# Patient Record
Sex: Female | Born: 1995
Health system: Southern US, Community
[De-identification: ages and names within clinical notes are randomized; demographics above are authoritative.]

## PROBLEM LIST (undated history)

## (undated) ENCOUNTER — Ambulatory Visit (HOSPITAL_COMMUNITY): Admission: EM | Payer: Managed Care, Other (non HMO)

---
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
## 2016-03-31 ENCOUNTER — Encounter (HOSPITAL_COMMUNITY): Payer: Self-pay | Admitting: Emergency Medicine

## 2016-03-31 DIAGNOSIS — S299XXA Unspecified injury of thorax, initial encounter: Secondary | ICD-10-CM | POA: Diagnosis present

## 2016-03-31 DIAGNOSIS — S40811A Abrasion of right upper arm, initial encounter: Secondary | ICD-10-CM | POA: Diagnosis not present

## 2016-03-31 DIAGNOSIS — Y939 Activity, unspecified: Secondary | ICD-10-CM | POA: Insufficient documentation

## 2016-03-31 DIAGNOSIS — S29012A Strain of muscle and tendon of back wall of thorax, initial encounter: Secondary | ICD-10-CM | POA: Insufficient documentation

## 2016-03-31 DIAGNOSIS — Y999 Unspecified external cause status: Secondary | ICD-10-CM | POA: Insufficient documentation

## 2016-03-31 DIAGNOSIS — Y92410 Unspecified street and highway as the place of occurrence of the external cause: Secondary | ICD-10-CM | POA: Diagnosis not present

## 2016-03-31 DIAGNOSIS — S20311A Abrasion of right front wall of thorax, initial encounter: Secondary | ICD-10-CM | POA: Diagnosis not present
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
## 2016-04-01 ENCOUNTER — Emergency Department (HOSPITAL_COMMUNITY): Payer: Managed Care, Other (non HMO)

## 2016-04-01 ENCOUNTER — Emergency Department (HOSPITAL_COMMUNITY)
Admission: EM | Admit: 2016-04-01 | Discharge: 2016-04-01 | Disposition: A | Discharge status: Home / Self Care | Payer: Managed Care, Other (non HMO) | Attending: Emergency Medicine | Admitting: Emergency Medicine

## 2016-04-01 DIAGNOSIS — T07XXXA Unspecified multiple injuries, initial encounter: Secondary | ICD-10-CM

## 2016-04-01 DIAGNOSIS — S29012A Strain of muscle and tendon of back wall of thorax, initial encounter: Secondary | ICD-10-CM

## 2016-04-01 MED ORDER — NAPROXEN 500 MG PO TABS
500.0000 mg | ORAL_TABLET | Freq: Once | ORAL | Status: AC
  Administered 2016-04-01: 500 mg via ORAL
  Filled 2016-04-01: qty 1

## 2016-04-01 MED ORDER — NAPROXEN 500 MG PO TABS: 500.0000 mg | ORAL_TABLET | Freq: Two times a day (BID) | ORAL | 0 refills | Status: DC

## 2016-04-01 MED ORDER — ONDANSETRON 8 MG PO TBDP
8.0000 mg | ORAL_TABLET | Freq: Once | ORAL | Status: AC
  Administered 2016-04-01: 8 mg via ORAL
  Filled 2016-04-01: qty 1

## 2016-04-01 MED ORDER — CYCLOBENZAPRINE HCL 10 MG PO TABS: 10.0000 mg | ORAL_TABLET | Freq: Two times a day (BID) | ORAL | 0 refills | Status: DC | PRN
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
## 2016-04-05 ENCOUNTER — Encounter (HOSPITAL_COMMUNITY): Payer: Self-pay

## 2016-04-05 ENCOUNTER — Emergency Department (HOSPITAL_COMMUNITY)
Admission: EM | Admit: 2016-04-05 | Discharge: 2016-04-05 | Disposition: A | Discharge status: Home / Self Care | Payer: Managed Care, Other (non HMO) | Attending: Emergency Medicine | Admitting: Emergency Medicine

## 2016-04-05 DIAGNOSIS — Z79899 Other long term (current) drug therapy: Secondary | ICD-10-CM | POA: Insufficient documentation

## 2016-04-05 DIAGNOSIS — R112 Nausea with vomiting, unspecified: Secondary | ICD-10-CM | POA: Diagnosis present

## 2016-04-05 LAB — CBC
HEMATOCRIT: 39.1 % (ref 36.0–46.0)
HEMOGLOBIN: 13 g/dL (ref 12.0–15.0)
MCH: 23.7 pg — ABNORMAL LOW (ref 26.0–34.0)
MCHC: 33.2 g/dL (ref 30.0–36.0)
MCV: 71.4 fL — AB (ref 78.0–100.0)
Platelets: 266 10*3/uL (ref 150–400)
RBC: 5.48 MIL/uL — AB (ref 3.87–5.11)
RDW: 13.5 % (ref 11.5–15.5)
WBC: 8.6 10*3/uL (ref 4.0–10.5)

## 2016-04-05 LAB — COMPREHENSIVE METABOLIC PANEL
ALT: 10 U/L — AB (ref 14–54)
AST: 17 U/L (ref 15–41)
Albumin: 4.2 g/dL (ref 3.5–5.0)
Alkaline Phosphatase: 70 U/L (ref 38–126)
Anion gap: 12 (ref 5–15)
BILIRUBIN TOTAL: 1.9 mg/dL — AB (ref 0.3–1.2)
BUN: 7 mg/dL (ref 6–20)
CO2: 23 mmol/L (ref 22–32)
CREATININE: 0.91 mg/dL (ref 0.44–1.00)
Calcium: 9.8 mg/dL (ref 8.9–10.3)
Chloride: 104 mmol/L (ref 101–111)
GFR calc Af Amer: >60 mL/min (ref >60)
GLUCOSE: 109 mg/dL — AB (ref 65–99)
Potassium: 3 mmol/L — ABNORMAL LOW (ref 3.5–5.1)
Sodium: 139 mmol/L (ref 135–145)
TOTAL PROTEIN: 7.4 g/dL (ref 6.5–8.1)

## 2016-04-05 LAB — URINALYSIS, ROUTINE W REFLEX MICROSCOPIC
GLUCOSE, UA: NEGATIVE mg/dL
Ketones, ur: >80 mg/dL — AB
Leukocytes, UA: NEGATIVE
Nitrite: NEGATIVE
Protein, ur: 30 mg/dL — AB
SPECIFIC GRAVITY, URINE: 1.038 — AB (ref 1.005–1.030)
pH: 6 (ref 5.0–8.0)

## 2016-04-05 LAB — URINE MICROSCOPIC-ADD ON

## 2016-04-05 LAB — POC URINE PREG, ED: PREG TEST UR: NEGATIVE

## 2016-04-05 MED ORDER — ONDANSETRON HCL 4 MG PO TABS: 4.0000 mg | ORAL_TABLET | Freq: Four times a day (QID) | ORAL | 0 refills | Status: DC

## 2016-04-05 MED ORDER — ONDANSETRON HCL 4 MG/2ML IJ SOLN
4.0000 mg | Freq: Once | INTRAMUSCULAR | Status: AC
  Administered 2016-04-05: 4 mg via INTRAVENOUS
  Filled 2016-04-05 (×2): qty 2

## 2016-04-05 MED ORDER — SODIUM CHLORIDE 0.9 % IV BOLUS (SEPSIS)
1000.0000 mL | Freq: Once | INTRAVENOUS | Status: AC
  Administered 2016-04-05: 1000 mL via INTRAVENOUS

## 2016-04-05 NOTE — ED Provider Notes (Signed)
[facility]EM[facility]Y DEPT Provider Note   CSN: [id number] Arrival date & time: 04/05/16  0353     History   Chief Complaint Chief Complaint  Patient presents with  . Emesis    HPI Cynthia Moss is a 20 y.o. female.  Patient presents with complaints of nausea and vomiting. Patient reports that symptoms began yesterday. She has not been able to hold anything down. She reports a cramping pain in the epigastric area when she vomits, but no continuous pain. She has had some nasal congestion and had a brief nosebleed earlier. She has not had any significant cough or shortness of breath. She has not had any diarrhea.      History reviewed. No pertinent past medical history.  There are no active problems to display for this patient.   History reviewed. No pertinent surgical history.  OB History    No data available       Home Medications    Prior to Admission medications   Medication Sig Start Date End Date Taking? Authorizing Provider  guaifenesin (ROBITUSSIN) 100 MG/5ML syrup Take 400 mg by mouth 3 (three) times daily as needed for cough.   Yes Historical Provider, MD  cyclobenzaprine (FLEXERIL) 10 MG tablet Take 1 tablet (10 mg total) by mouth 2 (two) times daily as needed for muscle spasms. 04/01/16   Antony MaduraKelly Humes, PA-C  naproxen (NAPROSYN) 500 MG tablet Take 1 tablet (500 mg total) by mouth 2 (two) times daily. 04/01/16   Antony MaduraKelly Humes, PA-C  ondansetron (ZOFRAN) 4 MG tablet Take 1 tablet (4 mg total) by mouth every 6 (six) hours. 04/05/16   Gilda Creasehristopher J Sanjna Haskew, MD    Family History No family history on file.  Social History Social History  Substance Use Topics  . Smoking status: Never Smoker  . Smokeless tobacco: Never Used  . Alcohol use No     Allergies   Review of patient's allergies indicates no known allergies.   Review of Systems Review of Systems  HENT: Positive for congestion.   Gastrointestinal: Positive for nausea and vomiting.  Genitourinary: Negative  for dysuria.  All other systems reviewed and are negative.    Physical Exam Updated Vital Signs BP 123/71   Pulse 94   Temp 98.6 F (37 C) (Oral)   Resp 19   Ht 5\' 5"  (1.651 m)   Wt 181 lb (82.1 kg)   LMP 03/24/2016   SpO2 100%   BMI 30.12 kg/m   Physical Exam  Constitutional: She is oriented to person, place, and time. She appears well-developed and well-nourished. No distress.  HENT:  Head: Normocephalic and atraumatic.  Right Ear: Hearing normal.  Left Ear: Hearing normal.  Nose: Nose normal.  Mouth/Throat: Oropharynx is clear and moist and mucous membranes are normal.  Eyes: Conjunctivae and EOM are normal. Pupils are equal, round, and reactive to light.  Neck: Normal range of motion. Neck supple.  Cardiovascular: Regular rhythm, S1 normal and S2 normal.  Exam reveals no gallop and no friction rub.   No murmur heard. Pulmonary/Chest: Effort normal and breath sounds normal. No respiratory distress. She exhibits no tenderness.  Abdominal: Soft. Normal appearance and bowel sounds are normal. There is no hepatosplenomegaly. There is no tenderness. There is no rebound, no guarding, no tenderness at McBurney's point and negative Murphy's sign. No hernia.  Musculoskeletal: Normal range of motion.  Neurological: She is alert and oriented to person, place, and time. She has normal strength. No cranial nerve deficit or sensory deficit. Coordination  normal. GCS eye subscore is 4. GCS verbal subscore is 5. GCS motor subscore is 6.  Skin: Skin is warm, dry and intact. No rash noted. No cyanosis.  Psychiatric: She has a normal mood and affect. Her speech is normal and behavior is normal. Thought content normal.  Nursing note and vitals reviewed.    ED Treatments / Results  Labs (all labs ordered are listed, but only abnormal results are displayed) Labs Reviewed  CBC - Abnormal; Notable for the following:       Result Value   RBC 5.48 (*)    MCV 71.4 (*)    MCH 23.7 (*)    All  other components within normal limits  COMPREHENSIVE METABOLIC PANEL - Abnormal; Notable for the following:    Potassium 3.0 (*)    Glucose, Bld 109 (*)    ALT 10 (*)    Total Bilirubin 1.9 (*)    All other components within normal limits  URINALYSIS, ROUTINE W REFLEX MICROSCOPIC (NOT AT [facility]) - Abnormal; Notable for the following:    Specific Gravity, Urine 1.038 (*)    Hgb urine dipstick SMALL (*)    Bilirubin Urine SMALL (*)    Ketones, ur >80 (*)    Protein, ur 30 (*)    All other components within normal limits  URINE MICROSCOPIC-ADD ON - Abnormal; Notable for the following:    Squamous Epithelial / LPF 6-30 (*)    Bacteria, UA MANY (*)    All other components within normal limits  POC URINE PREG, ED    EKG  EKG Interpretation None       Radiology No results found.  Procedures Procedures (including critical care time)  Medications Ordered in ED Medications  sodium chloride 0.9 % bolus 1,000 mL (0 mLs Intravenous Stopped 04/05/16 0514)  ondansetron (ZOFRAN) injection 4 mg (4 mg Intravenous Given 04/05/16 0417)     Initial Impression / Assessment and Plan / ED Course  I have reviewed the triage vital signs and the nursing notes.  Pertinent labs & imaging results that were available during my care of the patient were reviewed by me and considered in my medical decision making (see chart for details).  Clinical Course   Patient presents to the emergency department for evaluation of nausea and vomiting. Patient had a benign, nontender abdominal exam. Workup has been unremarkable. Patient administered antiemetics and IV fluids with improvement. Patient will be discharged with continued symptomatic treatment, oral hydration.  Final Clinical Impressions(s) / ED Diagnoses   Final diagnoses:  Nausea and vomiting, vomiting of unspecified type    New Prescriptions New Prescriptions   ONDANSETRON (ZOFRAN) 4 MG TABLET    Take 1 tablet (4 mg total) by mouth every 6 (six)  hours.     Gilda Crease, MD 04/05/16 [phone]

## 2016-04-05 NOTE — ED Triage Notes (Signed)
Pt here with c/o N/V and epigastric abdominal pain, onset yesterday. Pt actively vomiting during triage. Pt also reports constipation, LBM was yesterday but states it was hard.

## 2016-04-06 ENCOUNTER — Encounter (HOSPITAL_COMMUNITY): Payer: Self-pay | Admitting: Emergency Medicine

## 2016-04-06 ENCOUNTER — Emergency Department (HOSPITAL_COMMUNITY)
Admission: EM | Admit: 2016-04-06 | Discharge: 2016-04-06 | Disposition: A | Discharge status: Home / Self Care | Payer: Managed Care, Other (non HMO) | Attending: Emergency Medicine | Admitting: Emergency Medicine

## 2016-04-06 DIAGNOSIS — R112 Nausea with vomiting, unspecified: Secondary | ICD-10-CM | POA: Diagnosis present

## 2016-04-06 LAB — URINE MICROSCOPIC-ADD ON

## 2016-04-06 LAB — COMPREHENSIVE METABOLIC PANEL
ALT: 9 U/L — ABNORMAL LOW (ref 14–54)
AST: 15 U/L (ref 15–41)
Albumin: 4.2 g/dL (ref 3.5–5.0)
Alkaline Phosphatase: 67 U/L (ref 38–126)
Anion gap: 12 (ref 5–15)
BUN: 6 mg/dL (ref 6–20)
CHLORIDE: 106 mmol/L (ref 101–111)
CO2: 21 mmol/L — ABNORMAL LOW (ref 22–32)
Calcium: 9.6 mg/dL (ref 8.9–10.3)
Creatinine, Ser: 0.89 mg/dL (ref 0.44–1.00)
GFR calc Af Amer: >60 mL/min (ref >60)
Glucose, Bld: 102 mg/dL — ABNORMAL HIGH (ref 65–99)
POTASSIUM: 3.5 mmol/L (ref 3.5–5.1)
Sodium: 139 mmol/L (ref 135–145)
Total Bilirubin: 1.7 mg/dL — ABNORMAL HIGH (ref 0.3–1.2)
Total Protein: 7.4 g/dL (ref 6.5–8.1)

## 2016-04-06 LAB — URINALYSIS, ROUTINE W REFLEX MICROSCOPIC
GLUCOSE, UA: NEGATIVE mg/dL
Ketones, ur: >80 mg/dL — AB
LEUKOCYTES UA: NEGATIVE
Nitrite: NEGATIVE
PH: 5.5 (ref 5.0–8.0)
Protein, ur: 30 mg/dL — AB
SPECIFIC GRAVITY, URINE: 1.045 — AB (ref 1.005–1.030)

## 2016-04-06 LAB — CBC
HEMATOCRIT: 37.9 % (ref 36.0–46.0)
HEMOGLOBIN: 12.4 g/dL (ref 12.0–15.0)
MCH: 23.4 pg — ABNORMAL LOW (ref 26.0–34.0)
MCHC: 32.7 g/dL (ref 30.0–36.0)
MCV: 71.6 fL — AB (ref 78.0–100.0)
Platelets: 271 10*3/uL (ref 150–400)
RBC: 5.29 MIL/uL — AB (ref 3.87–5.11)
RDW: 13.8 % (ref 11.5–15.5)
WBC: 8.8 10*3/uL (ref 4.0–10.5)

## 2016-04-06 LAB — LIPASE, BLOOD: LIPASE: 24 U/L (ref 11–51)

## 2016-04-06 LAB — I-STAT BETA HCG BLOOD, ED (MC, WL, AP ONLY): I-stat hCG, quantitative: <5 m[IU]/mL (ref <5)

## 2016-04-06 MED ORDER — ONDANSETRON 8 MG PO TBDP: 8.0000 mg | ORAL_TABLET | Freq: Three times a day (TID) | ORAL | 0 refills | Status: DC | PRN

## 2016-04-06 MED ORDER — ONDANSETRON 4 MG PO TBDP
8.0000 mg | ORAL_TABLET | Freq: Once | ORAL | Status: AC
  Administered 2016-04-06: 8 mg via ORAL
  Filled 2016-04-06: qty 2

## 2016-04-06 MED ORDER — MORPHINE SULFATE (PF) 4 MG/ML IV SOLN: 4.0000 mg | Freq: Once | INTRAVENOUS | Status: DC

## 2016-04-06 NOTE — ED Notes (Signed)
Pt provided with d/c instructions at this time. Pt verbalizes understanding of d/c instructions as well as follow up procedure after d/c. Pt provided with RX for zofran 8 mg tablets. Pt verbalizes understanding of RX directions. Pt in no apparent distress at this time. Pt ambulatory at time of d/c.

## 2016-04-06 NOTE — ED Provider Notes (Signed)
[facility] Provider Note   CSN: [id number] Arrival date & time: 04/06/16  0044     History   Chief Complaint Chief Complaint  Patient presents with  . Emesis    HPI Cynthia Moss is a 20 y.o. female.  HPI Patient ports nausea and vomiting over the past 3 days.  She did have diarrhea today.  She was seen in emergency department yesterday for similar symptoms and worked up.  She was discharged home with a prescription for Zofran but she did not fill this.  She had started to feel better and she vomited again this evening and came to the ER for evaluation without filling her prescription.  She denies significant abdominal pain at this time.  No fevers or chills.  Denies cough or shortness of breath.  No dysuria or urinary frequency.  No recent sick contacts.  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    History reviewed. No pertinent past medical history.  There are no active problems to display for this patient.   History reviewed. No pertinent surgical history.  OB History    No data available       Home Medications    Prior to Admission medications   Medication Sig Start Date End Date Taking? Authorizing Provider  cyclobenzaprine (FLEXERIL) 10 MG tablet Take 1 tablet (10 mg total) by mouth 2 (two) times daily as needed for muscle spasms. 04/01/16   Antony MaduraKelly Humes, PA-C  guaifenesin (ROBITUSSIN) 100 MG/5ML syrup Take 400 mg by mouth 3 (three) times daily as needed for cough.    Historical Provider, MD  naproxen (NAPROSYN) 500 MG tablet Take 1 tablet (500 mg total) by mouth 2 (two) times daily. 04/01/16   Antony MaduraKelly Humes, PA-C  ondansetron (ZOFRAN ODT) 8 MG disintegrating tablet Take 1 tablet (8 mg total) by mouth every 8 (eight) hours as needed for nausea or vomiting. 04/06/16   Azalia BilisKevin Aurora Rody, MD  ondansetron (ZOFRAN) 4 MG tablet Take 1 tablet (4 mg total) by mouth every 6 (six) hours. 04/05/16   Gilda Creasehristopher J Pollina, MD    Family History No family history on  file.  Social History Social History  Substance Use Topics  . Smoking status: Never Smoker  . Smokeless tobacco: Never Used  . Alcohol use No     Allergies   Review of patient's allergies indicates no known allergies.   Review of Systems Review of Systems  All other systems reviewed and are negative.    Physical Exam Updated Vital Signs BP 134/73 (BP Location: Right Arm)   Pulse 105   Temp 98.4 F (36.9 C) (Oral)   Resp 20   Ht 5\' 5"  (1.651 m)   Wt 181 lb (82.1 kg)   LMP 03/24/2016   SpO2 100%   BMI 30.12 kg/m   Physical Exam  Constitutional: She is oriented to person, place, and time. She appears well-developed and well-nourished. No distress.  HENT:  Head: Normocephalic and atraumatic.  Eyes: EOM are normal.  Neck: Normal range of motion.  Cardiovascular: Normal rate, regular rhythm and normal heart sounds.   Pulmonary/Chest: Effort normal and breath sounds normal.  Abdominal: Soft. She exhibits no distension. There is no tenderness.  Musculoskeletal: Normal range of motion.  Neurological: She is alert and oriented to person, place, and time.  Skin: Skin is warm and dry.  Psychiatric: She has a normal mood and affect. Judgment normal.  Nursing note and vitals reviewed.    ED Treatments /  Results  Labs (all labs ordered are listed, but only abnormal results are displayed) Labs Reviewed  COMPREHENSIVE METABOLIC PANEL - Abnormal; Notable for the following:       Result Value   CO2 21 (*)    Glucose, Bld 102 (*)    ALT 9 (*)    Total Bilirubin 1.7 (*)    All other components within normal limits  CBC - Abnormal; Notable for the following:    RBC 5.29 (*)    MCV 71.6 (*)    MCH 23.4 (*)    All other components within normal limits  URINALYSIS, ROUTINE W REFLEX MICROSCOPIC (NOT AT [facility]) - Abnormal; Notable for the following:    Color, Urine AMBER (*)    APPearance TURBID (*)    Specific Gravity, Urine 1.045 (*)    Hgb urine dipstick LARGE (*)     Bilirubin Urine SMALL (*)    Ketones, ur >80 (*)    Protein, ur 30 (*)    All other components within normal limits  URINE MICROSCOPIC-ADD ON - Abnormal; Notable for the following:    Squamous Epithelial / LPF 6-30 (*)    Bacteria, UA MANY (*)    All other components within normal limits  LIPASE, BLOOD  POC URINE PREG, ED  I-STAT BETA HCG BLOOD, ED ([facility], [facility], [facility] ONLY)    EKG  EKG Interpretation None       Radiology No results found.  Procedures Procedures (including critical care time)  Medications Ordered in ED Medications  ondansetron (ZOFRAN-ODT) disintegrating tablet 8 mg (8 mg Oral Given 04/06/16 0537)     Initial Impression / Assessment and Plan / ED Course  I have reviewed the triage vital signs and the nursing notes.  Pertinent labs & imaging results that were available during my care of the patient were reviewed by me and considered in my medical decision making (see chart for details).  Clinical Course    Patient is overall well-appearing.  Repeat workup today without abnormality.  Abdominal exam benign.  No indication for advanced imaging.  Patient given Zofran here in the emergency department.  I encouraged her to fill her prescription for Zofran for her recurrent nausea vomiting.  This seems to be more of a viral process is that she given the episode of nonbloody diarrhea today.  Final Clinical Impressions(s) / ED Diagnoses   Final diagnoses:  Nausea and vomiting, vomiting of unspecified type    New Prescriptions Discharge Medication List as of 04/06/2016  5:33 AM    START taking these medications   Details  ondansetron (ZOFRAN ODT) 8 MG disintegrating tablet Take 1 tablet (8 mg total) by mouth every 8 (eight) hours as needed for nausea or vomiting., Starting Fri 04/06/2016, Print         Azalia Bilis, MD 04/06/16 [phone]
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
## 2016-06-20 ENCOUNTER — Encounter (HOSPITAL_COMMUNITY): Payer: Self-pay

## 2016-06-20 ENCOUNTER — Emergency Department (HOSPITAL_COMMUNITY)
Admission: EM | Admit: 2016-06-20 | Discharge: 2016-06-20 | Disposition: A | Discharge status: Home / Self Care | Payer: Managed Care, Other (non HMO) | Attending: Emergency Medicine | Admitting: Emergency Medicine

## 2016-06-20 DIAGNOSIS — R1110 Vomiting, unspecified: Secondary | ICD-10-CM | POA: Diagnosis present

## 2016-06-20 DIAGNOSIS — R1111 Vomiting without nausea: Secondary | ICD-10-CM | POA: Insufficient documentation

## 2016-06-20 LAB — URINALYSIS, ROUTINE W REFLEX MICROSCOPIC
GLUCOSE, UA: NEGATIVE mg/dL
KETONES UR: 15 mg/dL — AB
LEUKOCYTES UA: NEGATIVE
NITRITE: NEGATIVE
PH: 5.5 (ref 5.0–8.0)
PROTEIN: 100 mg/dL — AB
Specific Gravity, Urine: 1.027 (ref 1.005–1.030)

## 2016-06-20 LAB — COMPREHENSIVE METABOLIC PANEL
ALBUMIN: 4.1 g/dL (ref 3.5–5.0)
ALK PHOS: 65 U/L (ref 38–126)
ALT: 8 U/L — AB (ref 14–54)
AST: 16 U/L (ref 15–41)
Anion gap: 8 (ref 5–15)
BILIRUBIN TOTAL: 1.6 mg/dL — AB (ref 0.3–1.2)
BUN: 6 mg/dL (ref 6–20)
CO2: 25 mmol/L (ref 22–32)
CREATININE: 0.82 mg/dL (ref 0.44–1.00)
Calcium: 9.7 mg/dL (ref 8.9–10.3)
Chloride: 106 mmol/L (ref 101–111)
GFR calc Af Amer: >60 mL/min (ref >60)
GLUCOSE: 98 mg/dL (ref 65–99)
Potassium: 3.4 mmol/L — ABNORMAL LOW (ref 3.5–5.1)
Sodium: 139 mmol/L (ref 135–145)
TOTAL PROTEIN: 6.9 g/dL (ref 6.5–8.1)

## 2016-06-20 LAB — URINE MICROSCOPIC-ADD ON

## 2016-06-20 LAB — CBC
HEMATOCRIT: 36.8 % (ref 36.0–46.0)
Hemoglobin: 12.4 g/dL (ref 12.0–15.0)
MCH: 23.8 pg — ABNORMAL LOW (ref 26.0–34.0)
MCHC: 33.7 g/dL (ref 30.0–36.0)
MCV: 70.8 fL — ABNORMAL LOW (ref 78.0–100.0)
PLATELETS: 293 10*3/uL (ref 150–400)
RBC: 5.2 MIL/uL — ABNORMAL HIGH (ref 3.87–5.11)
RDW: 13.9 % (ref 11.5–15.5)
WBC: 5.9 10*3/uL (ref 4.0–10.5)

## 2016-06-20 LAB — LIPASE, BLOOD: Lipase: 24 U/L (ref 11–51)

## 2016-06-20 LAB — POC URINE PREG, ED: Preg Test, Ur: NEGATIVE

## 2016-06-20 MED ORDER — ONDANSETRON 4 MG PO TBDP
4.0000 mg | ORAL_TABLET | Freq: Once | ORAL | Status: AC
  Administered 2016-06-20: 4 mg via ORAL
  Filled 2016-06-20: qty 1

## 2016-06-20 MED ORDER — ONDANSETRON 4 MG PO TBDP: 4.0000 mg | ORAL_TABLET | Freq: Three times a day (TID) | ORAL | 0 refills | Status: DC | PRN

## 2016-06-20 NOTE — ED Provider Notes (Signed)
[facility] Provider Note   CSN: [id number] Arrival date & time: 06/20/16  0218     History   Chief Complaint Chief Complaint  Patient presents with  . Nausea  . Vomiting    HPI Cynthia Moss is a 20 y.o. female.  HPI  This is a 20 year old female with no significant past medical history who presents with vomiting. Patient reports onset of vomiting after eating some noodles yesterday afternoon. She states that the needles didn't "taste right." No one else is sick. She reports multiple episodes of nonbilious, nonbloody emesis. She reports abdominal cramping. Denies diarrhea. Current pain is 4 out of 10. She has not taken anything for her symptoms. Denies any fevers, chest pain, shortness of breath.  History reviewed. No pertinent past medical history.  There are no active problems to display for this patient.   History reviewed. No pertinent surgical history.  OB History    No data available       Home Medications    Prior to Admission medications   Medication Sig Start Date End Date Taking? Authorizing Provider  cyclobenzaprine (FLEXERIL) 10 MG tablet Take 1 tablet (10 mg total) by mouth 2 (two) times daily as needed for muscle spasms. Patient not taking: Reported on 06/20/2016 04/01/16   Antony MaduraKelly Humes, PA-C  naproxen (NAPROSYN) 500 MG tablet Take 1 tablet (500 mg total) by mouth 2 (two) times daily. Patient not taking: Reported on 06/20/2016 04/01/16   Antony MaduraKelly Humes, PA-C  ondansetron (ZOFRAN ODT) 4 MG disintegrating tablet Take 1 tablet (4 mg total) by mouth every 8 (eight) hours as needed for nausea or vomiting. 06/20/16   Shon Batonourtney F Kelijah Towry, MD  ondansetron (ZOFRAN) 4 MG tablet Take 1 tablet (4 mg total) by mouth every 6 (six) hours. Patient not taking: Reported on 06/20/2016 04/05/16   Gilda Creasehristopher J Pollina, MD    Family History No family history on file.  Social History Social History  Substance Use Topics  . Smoking status: Never Smoker  . Smokeless  tobacco: Never Used  . Alcohol use No     Allergies   Patient has no known allergies.   Review of Systems Review of Systems  Constitutional: Negative for fever.  Respiratory: Negative for shortness of breath.   Cardiovascular: Negative for chest pain.  Gastrointestinal: Positive for abdominal pain, nausea and vomiting. Negative for diarrhea.  Musculoskeletal: Positive for back pain.  All other systems reviewed and are negative.    Physical Exam Updated Vital Signs BP 124/78   Pulse 78   Temp 97.8 F (36.6 C) (Oral)   Resp 17   LMP 06/15/2016   SpO2 100%   Physical Exam  Constitutional: She is oriented to person, place, and time. She appears well-developed and well-nourished. No distress.  HENT:  Head: Normocephalic and atraumatic.  Cardiovascular: Normal rate, regular rhythm and normal heart sounds.   No murmur heard. Pulmonary/Chest: Effort normal and breath sounds normal. No respiratory distress. She has no wheezes.  Abdominal: Soft. Bowel sounds are normal. There is no tenderness. There is no guarding.  Neurological: She is alert and oriented to person, place, and time.  Skin: Skin is warm and dry.  Psychiatric: She has a normal mood and affect.  Nursing note and vitals reviewed.    ED Treatments / Results  Labs (all labs ordered are listed, but only abnormal results are displayed) Labs Reviewed  COMPREHENSIVE METABOLIC PANEL - Abnormal; Notable for the following:       Result Value  Potassium 3.4 (*)    ALT 8 (*)    Total Bilirubin 1.6 (*)    All other components within normal limits  CBC - Abnormal; Notable for the following:    RBC 5.20 (*)    MCV 70.8 (*)    MCH 23.8 (*)    All other components within normal limits  URINALYSIS, ROUTINE W REFLEX MICROSCOPIC ([facility]) - Abnormal; Notable for the following:    Color, Urine AMBER (*)    APPearance CLOUDY (*)    Hgb urine dipstick LARGE (*)    Bilirubin Urine SMALL (*)    Ketones, ur 15 (*)     Protein, ur 100 (*)    All other components within normal limits  URINE MICROSCOPIC-ADD ON - Abnormal; Notable for the following:    Squamous Epithelial / LPF 6-30 (*)    Bacteria, UA MANY (*)    All other components within normal limits  LIPASE, BLOOD  POC URINE PREG, ED    EKG  EKG Interpretation None       Radiology No results found.  Procedures Procedures (including critical care time)  Medications Ordered in ED Medications  ondansetron (ZOFRAN-ODT) disintegrating tablet 4 mg (not administered)  ondansetron (ZOFRAN-ODT) disintegrating tablet 4 mg (4 mg Oral Given 06/20/16 0453)     Initial Impression / Assessment and Plan / ED Course  I have reviewed the triage vital signs and the nursing notes.  Pertinent labs & imaging results that were available during my care of the patient were reviewed by me and considered in my medical decision making (see chart for details).  Clinical Course    Patient presents with abdominal cramping and vomiting. This is following eating yesterday. Otherwise nontoxic. Abdominal exam is benign. Vital signs reassuring. Patient given Zofran ODT. Lab work reviewed and largely reassuring. She is able to tolerate fluids. Suspect gastritis. Discharge home with Zofran.  After history, exam, and medical workup I feel the patient has been appropriately medically screened and is safe for discharge home. Pertinent diagnoses were discussed with the patient. Patient was given return precautions.   Final Clinical Impressions(s) / ED Diagnoses   Final diagnoses:  Non-intractable vomiting without nausea, unspecified vomiting type    New Prescriptions New Prescriptions   ONDANSETRON (ZOFRAN ODT) 4 MG DISINTEGRATING TABLET    Take 1 tablet (4 mg total) by mouth every 8 (eight) hours as needed for nausea or vomiting.     Shon Batonourtney F Doree Kuehne, MD 06/20/16 [phone]

## 2016-06-20 NOTE — ED Triage Notes (Signed)
Pt states she ate some noodles and has had n/v since; pt a&ox 4 on arrival. Pt states she has tightness in abdomen; pt c/o pain at 6/10 on arrival.

## 2016-07-27 ENCOUNTER — Encounter (HOSPITAL_COMMUNITY): Payer: Self-pay | Admitting: Emergency Medicine

## 2016-07-27 ENCOUNTER — Ambulatory Visit (HOSPITAL_COMMUNITY)
Admission: EM | Admit: 2016-07-27 | Discharge: 2016-07-27 | Disposition: A | Discharge status: Home / Self Care | Payer: Managed Care, Other (non HMO) | Attending: Family Medicine | Admitting: Family Medicine

## 2016-07-27 DIAGNOSIS — B9689 Other specified bacterial agents as the cause of diseases classified elsewhere: Secondary | ICD-10-CM | POA: Diagnosis not present

## 2016-07-27 DIAGNOSIS — N730 Acute parametritis and pelvic cellulitis: Secondary | ICD-10-CM | POA: Diagnosis not present

## 2016-07-27 DIAGNOSIS — N760 Acute vaginitis: Secondary | ICD-10-CM | POA: Insufficient documentation

## 2016-07-27 DIAGNOSIS — N739 Female pelvic inflammatory disease, unspecified: Secondary | ICD-10-CM | POA: Insufficient documentation

## 2016-07-27 MED ORDER — CEFTRIAXONE SODIUM 250 MG IJ SOLR
250.0000 mg | Freq: Once | INTRAMUSCULAR | Status: AC
  Administered 2016-07-27: 250 mg via INTRAMUSCULAR

## 2016-07-27 MED ORDER — DOXYCYCLINE HYCLATE 100 MG PO CAPS: 100.0000 mg | ORAL_CAPSULE | Freq: Two times a day (BID) | ORAL | 0 refills | Status: AC

## 2016-07-27 MED ORDER — LIDOCAINE HCL (PF) 1 % IJ SOLN
INTRAMUSCULAR | Status: AC
  Filled 2016-07-27: qty 2

## 2016-07-27 MED ORDER — METRONIDAZOLE 500 MG PO TABS: 500.0000 mg | ORAL_TABLET | Freq: Two times a day (BID) | ORAL | 0 refills | Status: DC

## 2016-07-27 MED ORDER — CEFTRIAXONE SODIUM 250 MG IJ SOLR
INTRAMUSCULAR | Status: AC
  Filled 2016-07-27: qty 250

## 2016-07-27 NOTE — ED Triage Notes (Signed)
Here for poss yeast infection onset 4 days associated w/vag itching  Denies vag d/c, urinary sx, fevers, abd pain  LMP = 07/23/16  Pt reports she just became sexually active last month in a monogamous relationship  A&O x4... NAD

## 2016-07-27 NOTE — Discharge Instructions (Signed)
Take your medicines as prescribed. You have been tested for gonorrhea, chlamydia, bacterial vaginosis, and trichomonas. You are being treated today for gonorrhea, and for PID along with bacterial vaginosis.   It is important to complete all of your antibiotics it is also important to avoid any alcohol while taking metronidazole.

## 2016-07-27 NOTE — ED Provider Notes (Signed)
CSN: [id number]     Arrival date & time 07/27/16  1019 History   First MD Initiated Contact with Patient 07/27/16 1039     Chief Complaint  Patient presents with  . Vaginitis   (Consider location/radiation/quality/duration/timing/severity/associated sxs/prior Treatment) 20 year old female presents to clinic with one week history of vaginal itching. Patient states she recently has become sexually active, with one partner and uses barrier methods for protection. Patient denies discharge, pain with intercourse, abdominal pain, or flank pain. She has been afebrile, no N/V/D, or other systemic symptoms   The history is provided by the patient.    No past medical history on file. History reviewed. No pertinent surgical history. History reviewed. No pertinent family history. Social History  Substance Use Topics  . Smoking status: Never Smoker  . Smokeless tobacco: Never Used  . Alcohol use Yes   OB History    No data available     Review of Systems  Constitutional: Negative.   HENT: Negative.   Eyes: Negative.   Respiratory: Negative.   Cardiovascular: Negative.   Gastrointestinal: Negative for abdominal pain, diarrhea and nausea.  Genitourinary: Negative for difficulty urinating, dyspareunia, dysuria, flank pain, frequency, genital sores, menstrual problem, urgency, vaginal bleeding, vaginal discharge and vaginal pain.  Musculoskeletal: Negative.   Neurological: Negative.     Allergies  Patient has no known allergies.  Home Medications   Prior to Admission medications   Medication Sig Start Date End Date Taking? Authorizing Provider  cyclobenzaprine (FLEXERIL) 10 MG tablet Take 1 tablet (10 mg total) by mouth 2 (two) times daily as needed for muscle spasms. Patient not taking: Reported on 06/20/2016 04/01/16   Antony MaduraKelly Humes, PA-C  doxycycline (VIBRAMYCIN) 100 MG capsule Take 1 capsule (100 mg total) by mouth 2 (two) times daily. 07/27/16 08/10/16  Dorena BodoLawrence Arsalan Brisbin, NP   metroNIDAZOLE (FLAGYL) 500 MG tablet Take 1 tablet (500 mg total) by mouth 2 (two) times daily. 07/27/16   Dorena BodoLawrence Jalilah Wiltsie, NP  naproxen (NAPROSYN) 500 MG tablet Take 1 tablet (500 mg total) by mouth 2 (two) times daily. Patient not taking: Reported on 06/20/2016 04/01/16   Antony MaduraKelly Humes, PA-C  ondansetron (ZOFRAN ODT) 4 MG disintegrating tablet Take 1 tablet (4 mg total) by mouth every 8 (eight) hours as needed for nausea or vomiting. 06/20/16   Shon Batonourtney F Horton, MD  ondansetron (ZOFRAN) 4 MG tablet Take 1 tablet (4 mg total) by mouth every 6 (six) hours. Patient not taking: Reported on 06/20/2016 04/05/16   Gilda Creasehristopher J Pollina, MD   Meds Ordered and Administered this Visit   Medications  cefTRIAXone (ROCEPHIN) injection 250 mg (not administered)    BP 134/72 (BP Location: Left Arm)   Pulse 109   Temp 98.2 F (36.8 C) (Oral)   Resp 16   LMP 07/23/2016   SpO2 100%  No data found.   Physical Exam  Constitutional: She is oriented to person, place, and time. She appears well-developed and well-nourished. No distress.  HENT:  Head: Normocephalic.  Right Ear: External ear normal.  Left Ear: External ear normal.  Neck: Normal range of motion. Neck supple. No JVD present.  Cardiovascular: Normal rate, regular rhythm and normal heart sounds.   Pulmonary/Chest: Effort normal and breath sounds normal. No respiratory distress. She has no wheezes.  Abdominal: Soft. Bowel sounds are normal. She exhibits no distension. There is no tenderness. There is no guarding.  Genitourinary: Pelvic exam was performed with patient prone. There is tenderness on the right labia. There  is no rash, lesion or injury on the right labia. There is tenderness on the left labia. There is no rash, lesion or injury on the left labia. Uterus is tender. There is tenderness in the vagina. No erythema or bleeding in the vagina. No foreign body in the vagina. No signs of injury around the vagina. Vaginal discharge found.   Lymphadenopathy:    She has no cervical adenopathy.  Neurological: She is alert and oriented to person, place, and time.  Skin: Skin is warm and dry. Capillary refill takes less than 2 seconds. She is not diaphoretic. No pallor.  Psychiatric: She has a normal mood and affect.    Urgent Care Course   Clinical Course     Procedures (including critical care time)  Labs Review Labs Reviewed  CERVICOVAGINAL ANCILLARY ONLY    Imaging Review No results found.   Visual Acuity Review  Right Eye Distance:   Left Eye Distance:   Bilateral Distance:    Right Eye Near:   Left Eye Near:    Bilateral Near:         MDM   1. PID (acute pelvic inflammatory disease)   2. BV (bacterial vaginosis)    Patient is being treated for GC/Cl[place], PID, and bacterial vaginosis. Specimens have been collected for cytology and patient has received an injection today of rocephin, and given a prescription of doxycycline for 2 weeks and metronidazole. UA and UA-preg collected. Should symptoms fail to resolve or worsen obtain and follow up with a PCP or return to clinic.     Dorena BodoLawrence Audryanna Zurita, NP 07/27/16 [phone]

## 2016-07-29 ENCOUNTER — Ambulatory Visit (HOSPITAL_COMMUNITY)
Admission: EM | Admit: 2016-07-29 | Discharge: 2016-07-29 | Disposition: A | Discharge status: Home / Self Care | Payer: Managed Care, Other (non HMO) | Attending: Emergency Medicine | Admitting: Emergency Medicine

## 2016-07-29 ENCOUNTER — Encounter (HOSPITAL_COMMUNITY): Payer: Self-pay

## 2016-07-29 DIAGNOSIS — B373 Candidiasis of vulva and vagina: Secondary | ICD-10-CM | POA: Diagnosis not present

## 2016-07-29 DIAGNOSIS — B3731 Acute candidiasis of vulva and vagina: Secondary | ICD-10-CM

## 2016-07-29 MED ORDER — FLUCONAZOLE 200 MG PO TABS: ORAL_TABLET | ORAL | 0 refills | Status: DC

## 2016-07-29 NOTE — Discharge Instructions (Signed)
Use an over the counter vaginal yeast cream such as MONISTAT for comfort of external vulvar itching and take a PROBIOTIC with lactobacillus daily for the next 3 months.

## 2016-07-29 NOTE — ED Triage Notes (Signed)
Pt was here 2 days ago for the same vaginal irritation but said now the discharge has gotten worse it's still thick white discharge, vaginal itching and thinks she may have seen some bumps but unsure.

## 2016-07-29 NOTE — ED Provider Notes (Signed)
CSN: [id number]     Arrival date & time 07/29/16  1457 History   First MD Initiated Contact with Patient 07/29/16 1701     Chief Complaint  Patient presents with  . Vaginal Discharge   (Consider location/radiation/quality/duration/timing/severity/associated sxs/prior Treatment)  HPI   The patient is a 20 year old female presenting tonight with complaints of a vaginal discharge and itching. Patient is concerned that "something else is wrong". Patient was seen here approximately 2 days ago for an STI workup and given antibiotic medications per CDC recommendations.  History reviewed. No pertinent past medical history. History reviewed. No pertinent surgical history. No family history on file. Social History  Substance Use Topics  . Smoking status: Never Smoker  . Smokeless tobacco: Never Used  . Alcohol use Yes   OB History    No data available     Review of Systems  Constitutional: Negative.  Negative for fatigue and fever.  HENT: Negative.   Eyes: Negative.  Negative for visual disturbance.  Respiratory: Negative.  Negative for cough and shortness of breath.   Cardiovascular: Negative.  Negative for chest pain and leg swelling.  Gastrointestinal: Negative.   Endocrine: Negative.   Genitourinary: Positive for vaginal discharge. Negative for difficulty urinating, dyspareunia, dysuria, flank pain, genital sores, pelvic pain and vaginal pain.  Musculoskeletal: Negative.  Negative for gait problem and neck stiffness.  Skin: Negative.   Allergic/Immunologic: Negative.   Neurological: Negative.  Negative for dizziness and headaches.  Hematological: Negative.   Psychiatric/Behavioral: Negative.     Allergies  Patient has no known allergies.  Home Medications   Prior to Admission medications   Medication Sig Start Date End Date Taking? Authorizing Provider  doxycycline (VIBRAMYCIN) 100 MG capsule Take 1 capsule (100 mg total) by mouth 2 (two) times daily. 07/27/16 08/10/16 Yes  Dorena BodoLawrence Kennard, NP  metroNIDAZOLE (FLAGYL) 500 MG tablet Take 1 tablet (500 mg total) by mouth 2 (two) times daily. 07/27/16  Yes Dorena BodoLawrence Kennard, NP  cyclobenzaprine (FLEXERIL) 10 MG tablet Take 1 tablet (10 mg total) by mouth 2 (two) times daily as needed for muscle spasms. Patient not taking: Reported on 06/20/2016 04/01/16   Antony MaduraKelly Humes, PA-C  fluconazole (DIFLUCAN) 200 MG tablet Take one (200mg ) tablet on day one and then take the second (200mg ) tablet on day 3. 07/29/16   Servando Salinaatherine H Maki Sweetser, NP  naproxen (NAPROSYN) 500 MG tablet Take 1 tablet (500 mg total) by mouth 2 (two) times daily. Patient not taking: Reported on 06/20/2016 04/01/16   Antony MaduraKelly Humes, PA-C  ondansetron (ZOFRAN ODT) 4 MG disintegrating tablet Take 1 tablet (4 mg total) by mouth every 8 (eight) hours as needed for nausea or vomiting. 06/20/16   Shon Batonourtney F Horton, MD  ondansetron (ZOFRAN) 4 MG tablet Take 1 tablet (4 mg total) by mouth every 6 (six) hours. Patient not taking: Reported on 06/20/2016 04/05/16   Gilda Creasehristopher J Pollina, MD   Meds Ordered and Administered this Visit  Medications - No data to display  BP 139/89 (BP Location: Right Arm)   Pulse 98   Temp 98.4 F (36.9 C) (Oral)   Resp 16   LMP 07/23/2016   SpO2 100%  No data found.   Physical Exam  Constitutional: She appears well-developed and well-nourished. No distress.  Eyes: Right eye exhibits no discharge. Left eye exhibits no discharge. No scleral icterus.  Cardiovascular: Normal rate, regular rhythm, normal heart sounds and intact distal pulses.  Exam reveals no gallop and no friction rub.   No  murmur heard. Pulmonary/Chest: Effort normal and breath sounds normal. No respiratory distress. She has no wheezes. She has no rales. She exhibits no tenderness.  Genitourinary:  Genitourinary Comments: External vaginal exam reveals thick white cottage cheese like discharge.  Some erythema of labia noted.   Skin: Skin is warm and dry. She is not diaphoretic.   Nursing note and vitals reviewed.   Urgent Care Course   Clinical Course     Procedures (including critical care time)  Labs Review Labs Reviewed - No data to display  Imaging Review No results found.     MDM   1. Candidiasis of vagina    Meds ordered this encounter  Medications  . fluconazole (DIFLUCAN) 200 MG tablet    Sig: Take one (200mg ) tablet on day one and then take the second (200mg ) tablet on day 3.    Dispense:  2 tablet    Refill:  0   OTC monistat cream to external genitals for comfort for next week and a probiotic for next 3 months. The usual and customary discharge instructions and warnings were given.  The patient verbalizes understanding and agrees to plan of care.       Servando Salinaatherine H Kinaya Hilliker, NP 07/29/16 1737

## 2016-07-31 LAB — CERVICOVAGINAL ANCILLARY ONLY
CHLAMYDIA, DNA PROBE: NEGATIVE
NEISSERIA GONORRHEA: NEGATIVE

## 2016-08-01 LAB — CERVICOVAGINAL ANCILLARY ONLY: Wet Prep (BD Affirm): POSITIVE — AB

## 2021-01-23 ENCOUNTER — Emergency Department (HOSPITAL_COMMUNITY)
Admission: EM | Admit: 2021-01-23 | Discharge: 2021-01-23 | Disposition: A | Discharge status: Home / Self Care | Payer: 59 | Attending: Emergency Medicine | Admitting: Emergency Medicine

## 2021-01-23 ENCOUNTER — Encounter (HOSPITAL_COMMUNITY): Payer: Self-pay

## 2021-01-23 ENCOUNTER — Other Ambulatory Visit: Payer: Self-pay

## 2021-01-23 ENCOUNTER — Ambulatory Visit: Admit: 2021-01-23 | Payer: Self-pay

## 2021-01-23 DIAGNOSIS — R519 Headache, unspecified: Secondary | ICD-10-CM | POA: Insufficient documentation

## 2021-01-23 DIAGNOSIS — Z5321 Procedure and treatment not carried out due to patient leaving prior to being seen by health care provider: Secondary | ICD-10-CM | POA: Diagnosis not present
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
## 2021-01-23 NOTE — ED Notes (Signed)
PT left AMA 

## 2021-01-26 ENCOUNTER — Encounter (HOSPITAL_BASED_OUTPATIENT_CLINIC_OR_DEPARTMENT_OTHER): Payer: Self-pay

## 2021-01-26 ENCOUNTER — Other Ambulatory Visit: Payer: Self-pay

## 2021-01-26 ENCOUNTER — Emergency Department (HOSPITAL_BASED_OUTPATIENT_CLINIC_OR_DEPARTMENT_OTHER): Payer: 59

## 2021-01-26 ENCOUNTER — Emergency Department (HOSPITAL_BASED_OUTPATIENT_CLINIC_OR_DEPARTMENT_OTHER)
Admission: EM | Admit: 2021-01-26 | Discharge: 2021-01-26 | Disposition: A | Discharge status: Home / Self Care | Payer: 59 | Attending: Emergency Medicine | Admitting: Emergency Medicine

## 2021-01-26 DIAGNOSIS — J0130 Acute sphenoidal sinusitis, unspecified: Secondary | ICD-10-CM | POA: Insufficient documentation

## 2021-01-26 DIAGNOSIS — Y92410 Unspecified street and highway as the place of occurrence of the external cause: Secondary | ICD-10-CM | POA: Insufficient documentation

## 2021-01-26 DIAGNOSIS — H05221 Edema of right orbit: Secondary | ICD-10-CM | POA: Insufficient documentation

## 2021-01-26 DIAGNOSIS — H5711 Ocular pain, right eye: Secondary | ICD-10-CM | POA: Diagnosis not present

## 2021-01-26 DIAGNOSIS — R519 Headache, unspecified: Secondary | ICD-10-CM

## 2021-01-26 MED ORDER — AMOXICILLIN-POT CLAVULANATE 875-125 MG PO TABS: 1.0000 | ORAL_TABLET | Freq: Two times a day (BID) | ORAL | 0 refills | Status: AC

## 2021-01-26 NOTE — ED Notes (Signed)
Pt A&OX4 ambulatory at d/c with independent steady gait, NAD. Pt verbalized understanding of d/c instructions and follow up care. ?
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
## 2021-02-09 ENCOUNTER — Other Ambulatory Visit: Payer: Self-pay

## 2021-02-09 ENCOUNTER — Encounter (HOSPITAL_BASED_OUTPATIENT_CLINIC_OR_DEPARTMENT_OTHER): Payer: Self-pay | Admitting: *Deleted

## 2021-02-09 DIAGNOSIS — W272XXA Contact with scissors, initial encounter: Secondary | ICD-10-CM | POA: Diagnosis not present

## 2021-02-09 DIAGNOSIS — S01312A Laceration without foreign body of left ear, initial encounter: Secondary | ICD-10-CM | POA: Insufficient documentation

## 2021-02-09 DIAGNOSIS — S09302A Unspecified injury of left middle and inner ear, initial encounter: Secondary | ICD-10-CM | POA: Diagnosis present

## 2021-02-09 NOTE — ED Triage Notes (Signed)
C/o left ear lac by hair scissors @ 5 pm today

## 2021-02-10 ENCOUNTER — Emergency Department (HOSPITAL_BASED_OUTPATIENT_CLINIC_OR_DEPARTMENT_OTHER)
Admission: EM | Admit: 2021-02-10 | Discharge: 2021-02-10 | Disposition: A | Discharge status: Home / Self Care | Payer: 59 | Attending: Emergency Medicine | Admitting: Emergency Medicine

## 2021-02-10 DIAGNOSIS — S01312A Laceration without foreign body of left ear, initial encounter: Secondary | ICD-10-CM

## 2021-02-10 NOTE — ED Provider Notes (Signed)
   [facility] Provider Note: Lowella Dell, MD, FACEP  CSN: [id number] MRN: [id number] ARRIVAL: 02/09/21 at 2226 ROOM: [facility]08/[facility]   CHIEF COMPLAINT  Ear Laceration   HISTORY OF PRESENT ILLNESS  02/10/21 1:40 AM Cynthia Moss is a 25 y.o. female who had her left ear cut by a hair scissors at about 5 PM yesterday afternoon.  Bleeding has been controlled with pressure and she is having no pain.   History reviewed. No pertinent past medical history.  History reviewed. No pertinent surgical history.  No family history on file.  Social History   Tobacco Use   Smoking status: Never   Smokeless tobacco: Never  Substance Use Topics   Alcohol use: Yes   Drug use: No    Prior to Admission medications   Not on File    Allergies Patient has no known allergies.   REVIEW OF SYSTEMS  Negative except as noted here or in the History of Present Illness.   PHYSICAL EXAMINATION  Initial Vital Signs Blood pressure 133/80, pulse 96, temperature 98.6 F (37 C), temperature source Oral, resp. rate 16, height 5\' 5"  (1.651 m), weight 75.8 kg, last menstrual period 02/02/2021, SpO2 100 %.  Examination General: Well-developed, well-nourished female in no acute distress; appearance consistent with age of record HENT: normocephalic; small laceration to helix of left ear Eyes: Normal appearance Neck: supple Heart: regular rate and rhythm Lungs: clear to auscultation bilaterally Abdomen: soft; nondistended; nontender; bowel sounds present Extremities: No deformity; full range of motion Neurologic: Awake, alert and oriented; motor function intact in all extremities and symmetric; no facial droop Skin: Warm and dry Psychiatric: Normal mood and affect   RESULTS  Summary of this visit's results, reviewed and interpreted by myself:   EKG Interpretation  Date/Time:    Ventricular Rate:    PR Interval:    QRS Duration:   QT Interval:    QTC Calculation:   R Axis:      Text Interpretation:         Laboratory Studies: No results found for this or any previous visit (from the past 24 hour(s)). Imaging Studies: No results found.  ED COURSE and MDM  Nursing notes, initial and subsequent vitals signs, including pulse oximetry, reviewed and interpreted by myself.  Vitals:   02/09/21 2237 02/09/21 2240  BP:  133/80  Pulse:  96  Resp:  16  Temp:  98.6 F (37 C)  TempSrc:  Oral  SpO2:  100%  Weight: 75.8 kg   Height: 5\' 5"  (1.651 m)    Medications - No data to display    PROCEDURES  Procedures LACERATION REPAIR Performed by: 02/11/21 Atiyah Bauer Authorized by: Mariah Gerstenberger Consent: Verbal consent obtained. Risks and benefits: risks, benefits and alternatives were discussed Consent given by: patient Patient identity confirmed: provided demographic data Prepped and Draped in normal sterile fashion Wound explored  Laceration Location: Helix of left ear  Laceration Length: 0.5 cm  No Foreign Bodies seen or palpated  Anesthesia: None  Irrigation method: Wound cleaner Amount of cleaning: standard  Skin closure: Dermabond  Patient tolerance: Patient tolerated the procedure well with no immediate complications.   ED DIAGNOSES     ICD-10-CM   1. Laceration of helix of left ear, initial encounter  Carlisle Beers          Carlisle Beers, MD 02/10/21 0150

## 2021-02-12 ENCOUNTER — Other Ambulatory Visit: Payer: Self-pay

## 2021-02-12 ENCOUNTER — Encounter (HOSPITAL_BASED_OUTPATIENT_CLINIC_OR_DEPARTMENT_OTHER): Payer: Self-pay | Admitting: *Deleted

## 2021-02-12 ENCOUNTER — Emergency Department (HOSPITAL_BASED_OUTPATIENT_CLINIC_OR_DEPARTMENT_OTHER)
Admission: EM | Admit: 2021-02-12 | Discharge: 2021-02-12 | Disposition: A | Discharge status: Home / Self Care | Payer: 59 | Attending: Emergency Medicine | Admitting: Emergency Medicine

## 2021-02-12 DIAGNOSIS — R252 Cramp and spasm: Secondary | ICD-10-CM | POA: Insufficient documentation

## 2021-02-12 DIAGNOSIS — R112 Nausea with vomiting, unspecified: Secondary | ICD-10-CM | POA: Insufficient documentation

## 2021-02-12 DIAGNOSIS — M791 Myalgia, unspecified site: Secondary | ICD-10-CM | POA: Insufficient documentation

## 2021-02-12 DIAGNOSIS — R197 Diarrhea, unspecified: Secondary | ICD-10-CM | POA: Diagnosis not present

## 2021-02-12 LAB — COMPREHENSIVE METABOLIC PANEL
ALT: 7 U/L (ref 0–44)
AST: 14 U/L — ABNORMAL LOW (ref 15–41)
Albumin: 4.1 g/dL (ref 3.5–5.0)
Alkaline Phosphatase: 49 U/L (ref 38–126)
Anion gap: 8 (ref 5–15)
BUN: 7 mg/dL (ref 6–20)
CO2: 26 mmol/L (ref 22–32)
Calcium: 8.8 mg/dL — ABNORMAL LOW (ref 8.9–10.3)
Chloride: 103 mmol/L (ref 98–111)
Creatinine, Ser: 0.85 mg/dL (ref 0.44–1.00)
GFR, Estimated: >60 mL/min (ref >60)
Glucose, Bld: 94 mg/dL (ref 70–99)
Potassium: 3.4 mmol/L — ABNORMAL LOW (ref 3.5–5.1)
Sodium: 137 mmol/L (ref 135–145)
Total Bilirubin: 2 mg/dL — ABNORMAL HIGH (ref 0.3–1.2)
Total Protein: 6.9 g/dL (ref 6.5–8.1)

## 2021-02-12 LAB — URINALYSIS, ROUTINE W REFLEX MICROSCOPIC
Bilirubin Urine: NEGATIVE
Glucose, UA: NEGATIVE mg/dL
Ketones, ur: NEGATIVE mg/dL
Leukocytes,Ua: NEGATIVE
Nitrite: NEGATIVE
Specific Gravity, Urine: 1.022 (ref 1.005–1.030)
pH: 6 (ref 5.0–8.0)

## 2021-02-12 LAB — CBC
HCT: 37.8 % (ref 36.0–46.0)
Hemoglobin: 12.2 g/dL (ref 12.0–15.0)
MCH: 23.4 pg — ABNORMAL LOW (ref 26.0–34.0)
MCHC: 32.3 g/dL (ref 30.0–36.0)
MCV: 72.6 fL — ABNORMAL LOW (ref 80.0–100.0)
Platelets: 275 10*3/uL (ref 150–400)
RBC: 5.21 MIL/uL — ABNORMAL HIGH (ref 3.87–5.11)
RDW: 13.8 % (ref 11.5–15.5)
WBC: 4.7 10*3/uL (ref 4.0–10.5)
nRBC: 0 % (ref 0.0–0.2)

## 2021-02-12 LAB — LIPASE, BLOOD: Lipase: 23 U/L (ref 11–51)

## 2021-02-12 LAB — PREGNANCY, URINE: Preg Test, Ur: NEGATIVE

## 2021-02-12 MED ORDER — KETOROLAC TROMETHAMINE 30 MG/ML IJ SOLN
30.0000 mg | Freq: Once | INTRAMUSCULAR | Status: AC
  Administered 2021-02-12: 30 mg via INTRAVENOUS
  Filled 2021-02-12: qty 1

## 2021-02-12 MED ORDER — ONDANSETRON HCL 4 MG/2ML IJ SOLN
4.0000 mg | Freq: Once | INTRAMUSCULAR | Status: AC
  Administered 2021-02-12: 4 mg via INTRAVENOUS
  Filled 2021-02-12: qty 2

## 2021-02-12 MED ORDER — ONDANSETRON HCL 4 MG PO TABS: 4.0000 mg | ORAL_TABLET | Freq: Three times a day (TID) | ORAL | 0 refills | Status: DC | PRN

## 2021-02-12 MED ORDER — SODIUM CHLORIDE 0.9 % IV BOLUS
1000.0000 mL | Freq: Once | INTRAVENOUS | Status: AC
  Administered 2021-02-12: 1000 mL via INTRAVENOUS

## 2021-02-12 NOTE — ED Notes (Signed)
gingerale given

## 2021-02-12 NOTE — Discharge Instructions (Addendum)
You were seen in the emergency department for nausea vomiting diarrhea and leg cramps.  You received some nausea medication and fluids with improvement in your symptoms.  We are prescribing some nausea medication for home if needed.  Please continue to drink plenty of fluids.

## 2021-02-12 NOTE — ED Notes (Signed)
PO Challenge given.

## 2021-02-12 NOTE — ED Triage Notes (Addendum)
N/V/D on Friday, diarrhea stopped, vomiting decreased last emesis yesterday 1400, nausea returned this morning. Last diarrhea Friday. Legs cramping started last night.

## 2021-02-12 NOTE — ED Provider Notes (Signed)
[facility] EMERGENCY DEPT Provider Note   CSN: [id number] Arrival date & time: 02/12/21  0800     History Chief Complaint  Patient presents with   Emesis    Cynthia Moss is a 25 y.o. female.  She is complaining of nausea for 3 days.  It was associated with a little bit of diarrhea 3 days ago none since.  Vomited a few times yesterday.  Started with leg cramps last night and continued today along with nausea.  No fevers.  No cough shortness of breath abdominal pain urinary symptoms vaginal bleeding or discharge.  Last menstrual period was about 3 weeks ago.  No prior surgical history.  The history is provided by the patient.  Emesis Severity:  Moderate Duration:  1 day Timing:  Sporadic Quality:  Stomach contents Progression:  Partially resolved Recent urination:  Normal Relieved by:  None tried Worsened by:  Nothing Ineffective treatments:  None tried Associated symptoms: diarrhea and myalgias   Associated symptoms: no abdominal pain, no cough, no fever, no headaches and no sore throat   Myalgias:    Location:  Legs   Quality:  Cramping   Severity:  Moderate   Onset quality:  Gradual   Duration:  2 days   Progression:  Unchanged Risk factors: not pregnant, no prior abdominal surgery and no sick contacts       History reviewed. No pertinent past medical history.  There are no problems to display for this patient.   History reviewed. No pertinent surgical history.   OB History   No obstetric history on file.     No family history on file.  Social History   Tobacco Use   Smoking status: Never   Smokeless tobacco: Never  Substance Use Topics   Alcohol use: Yes   Drug use: No    Home Medications Prior to Admission medications   Not on File    Allergies    Patient has no known allergies.  Review of Systems   Review of Systems  Constitutional:  Negative for fever.  HENT:  Negative for sore throat.   Eyes:  Negative for visual  disturbance.  Respiratory:  Negative for cough and shortness of breath.   Cardiovascular:  Negative for chest pain.  Gastrointestinal:  Positive for diarrhea, nausea and vomiting. Negative for abdominal pain.  Genitourinary:  Negative for dysuria.  Musculoskeletal:  Positive for myalgias.  Skin:  Negative for rash.  Neurological:  Negative for headaches.   Physical Exam Updated Vital Signs BP 124/84 (BP Location: Right Arm)   Pulse 72   Temp 98.5 F (36.9 C) (Oral)   Resp 15   Ht 5\' 5"  (1.651 m)   Wt 75.8 kg   LMP 02/02/2021 (Exact Date)   SpO2 100%   BMI 27.79 kg/m   Physical Exam Vitals and nursing note reviewed.  Constitutional:      General: She is not in acute distress.    Appearance: Normal appearance. She is well-developed.  HENT:     Head: Normocephalic and atraumatic.  Eyes:     Conjunctiva/sclera: Conjunctivae normal.  Cardiovascular:     Rate and Rhythm: Normal rate and regular rhythm.     Heart sounds: No murmur heard. Pulmonary:     Effort: Pulmonary effort is normal. No respiratory distress.     Breath sounds: Normal breath sounds.  Abdominal:     Palpations: Abdomen is soft.     Tenderness: There is no abdominal tenderness. There is no  guarding or rebound.  Musculoskeletal:        General: No deformity or signs of injury. Normal range of motion.     Cervical back: Neck supple.  Skin:    General: Skin is warm and dry.  Neurological:     General: No focal deficit present.     Mental Status: She is alert.    ED Results / Procedures / Treatments   Labs (all labs ordered are listed, but only abnormal results are displayed) Labs Reviewed  COMPREHENSIVE METABOLIC PANEL - Abnormal; Notable for the following components:      Result Value   Potassium 3.4 (*)    Calcium 8.8 (*)    AST 14 (*)    Total Bilirubin 2.0 (*)    All other components within normal limits  CBC - Abnormal; Notable for the following components:   RBC 5.21 (*)    MCV 72.6 (*)     MCH 23.4 (*)    All other components within normal limits  URINALYSIS, ROUTINE W REFLEX MICROSCOPIC - Abnormal; Notable for the following components:   Hgb urine dipstick MODERATE (*)    Protein, ur TRACE (*)    Bacteria, UA RARE (*)    All other components within normal limits  LIPASE, BLOOD  PREGNANCY, URINE    EKG None  Radiology No results found.  Procedures Procedures   Medications Ordered in ED Medications  ondansetron (ZOFRAN) injection 4 mg (4 mg Intravenous Given 02/12/21 0851)  sodium chloride 0.9 % bolus 1,000 mL (0 mLs Intravenous Stopped 02/12/21 1014)  ketorolac (TORADOL) 30 MG/ML injection 30 mg (30 mg Intravenous Given 02/12/21 7517)    ED Course  I have reviewed the triage vital signs and the nursing notes.  Pertinent labs & imaging results that were available during my care of the patient were reviewed by me and considered in my medical decision making (see chart for details).  Clinical Course as of 02/12/21 1649  Sun Feb 12, 2021  1001 She feels better after fluids and meds.  Labs do not show any significant abnormalities.  Bilirubin elevated although has been in past. [MB]    Clinical Course User Index [MB] Terrilee Files, MD   MDM Rules/Calculators/A&P                         This patient complains of leg cramps, nausea and vomiting and diarrhea; this involves an extensive number of treatment Options and is a complaint that carries with it a high risk of complications and Morbidity. The differential includes gastroenteritis, peptic ulcer disease, gastritis, metabolic derangement, UTI  I ordered, reviewed and interpreted labs, which included CBC with normal white count normal hemoglobin, chemistries fairly unremarkable other than mildly low potassium and calcium, elevated T bili seen in the past, question Gilbert's I ordered medication IV fluids Toradol Zofran with improvement in her symptoms Previous records obtained and reviewed in epic, patient  does have some recent visits for similar presentation  After the interventions stated above, I reevaluated the patient and found patient to be very well-appearing and hemodynamically stable.  Tolerating p.o. here.  Will provide prescription for some Zofran.  Return instructions discussed   Final Clinical Impression(s) / ED Diagnoses Final diagnoses:  Nausea vomiting and diarrhea  Bilateral leg cramps    Rx / DC Orders ED Discharge Orders          Ordered    ondansetron (ZOFRAN) 4 MG tablet  Every 8 hours PRN        02/12/21 0932             Terrilee Files, MD 02/12/21 [phone]
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
## 2023-09-20 DIAGNOSIS — R300 Dysuria: Secondary | ICD-10-CM | POA: Diagnosis not present

## 2023-09-20 DIAGNOSIS — N3001 Acute cystitis with hematuria: Secondary | ICD-10-CM | POA: Diagnosis not present

## 2023-09-20 DIAGNOSIS — Z789 Other specified health status: Secondary | ICD-10-CM | POA: Diagnosis not present

## 2023-09-20 DIAGNOSIS — R350 Frequency of micturition: Secondary | ICD-10-CM | POA: Diagnosis not present

## 2023-09-20 DIAGNOSIS — Z6826 Body mass index (BMI) 26.0-26.9, adult: Secondary | ICD-10-CM | POA: Diagnosis not present

## 2024-01-19 ENCOUNTER — Encounter (HOSPITAL_COMMUNITY): Payer: Self-pay

## 2024-01-19 ENCOUNTER — Ambulatory Visit (HOSPITAL_COMMUNITY)
Admission: RE | Admit: 2024-01-19 | Discharge: 2024-01-19 | Disposition: A | Discharge status: Home / Self Care | Payer: Self-pay | Source: Ambulatory Visit

## 2024-01-19 ENCOUNTER — Ambulatory Visit (INDEPENDENT_AMBULATORY_CARE_PROVIDER_SITE_OTHER)

## 2024-01-19 VITALS — BP 109/73 | HR 88 | Temp 98.9°F | Resp 18 | Ht 65.0 in | Wt 165.0 lb

## 2024-01-19 DIAGNOSIS — S93506A Unspecified sprain of unspecified lesser toe(s), initial encounter: Secondary | ICD-10-CM

## 2024-01-19 DIAGNOSIS — M79674 Pain in right toe(s): Secondary | ICD-10-CM

## 2024-01-19 MED ORDER — IBUPROFEN 600 MG PO TABS: 600.0000 mg | ORAL_TABLET | Freq: Four times a day (QID) | ORAL | 0 refills | Status: AC | PRN

## 2024-01-19 NOTE — Discharge Instructions (Signed)
  1. Sprain of fifth toe (Primary) - DG Toe 5th Right x-ray performed in UC shows no acute fracture to the right fifth toe. - Apply Post op shoe for protection and immobilization of the toe until injury heals. - ibuprofen (ADVIL) 600 MG tablet; Take 1 tablet (600 mg total) by mouth every 6 (six) hours as needed.  Dispense: 30 tablet; Refill: 0

## 2024-01-19 NOTE — ED Triage Notes (Signed)
 Tuesday at work 2 high chairs fell on my pinky toe. Bruising went away after a few days. The pain went away after a few days was able to walk better. Friday night a week and some change later it started hurting again. - Entered by patient.  Patient presenting with returning toe pain in the right pinky toe onset 2 days ago. Patient walking with a visible limp and toe is swollen.  Prescriptions or OTC medications tried: No

## 2024-01-19 NOTE — ED Provider Notes (Signed)
 [facility]  Note:  This document was prepared using Dragon voice recognition software and may include unintentional dictation errors.  MRN: [id number] DOB: 1995/10/07  Subjective:   Cynthia Moss is a 28 y.o. female presenting for right fifth toe pain x 5 days.  Patient reports that while at work a highchair fell on her foot hitting her fifth toe.  Patient reports that there was bruising and swelling initially for the first few days.  Patient reports that swelling and bruising got better however pain started again approximately 2 days ago.  Patient reports increased pain with ambulation.  Has not taken any over-the-counter pain medication.  Patient denies any prior chronic injury or trauma to foot.  No current facility-administered medications for this encounter.  Current Outpatient Medications:    ibuprofen (ADVIL) 600 MG tablet, Take 1 tablet (600 mg total) by mouth every 6 (six) hours as needed., Disp: 30 tablet, Rfl: 0   No Known Allergies  History reviewed. No pertinent past medical history.   History reviewed. No pertinent surgical history.  History reviewed. No pertinent family history.  Social History   Tobacco Use   Smoking status: Never   Smokeless tobacco: Never  Substance Use Topics   Alcohol use: Yes   Drug use: No    ROS Refer to HPI for ROS details.  Objective:   Vitals: BP 109/73 (BP Location: Left Arm)   Pulse 88   Temp 98.9 F (37.2 C) (Oral)   Resp 18   Ht 5' 5 (1.651 m)   Wt 165 lb (74.8 kg)   LMP 12/27/2023 (Approximate)   SpO2 98%   BMI 27.46 kg/m   Physical Exam Vitals and nursing note reviewed.  Constitutional:      General: She is not in acute distress.    Appearance: Normal appearance. She is well-developed. She is not ill-appearing or toxic-appearing.  HENT:     Head: Normocephalic and atraumatic.   Cardiovascular:     Rate and Rhythm: Normal rate.  Pulmonary:     Effort: Pulmonary effort is normal. No  respiratory distress.   Musculoskeletal:     Right foot: Decreased range of motion. Normal capillary refill. Tenderness and bony tenderness present. No swelling or deformity. Normal pulse.   Skin:    General: Skin is warm and dry.   Neurological:     General: No focal deficit present.     Mental Status: She is alert and oriented to person, place, and time.   Psychiatric:        Mood and Affect: Mood normal.        Behavior: Behavior normal.     Procedures  No results found for this or any previous visit (from the past 24 hours).  DG Toe 5th Right Result Date: 01/19/2024 CLINICAL DATA:  Toe pain. EXAM: RIGHT FIFTH TOE COMPARISON:  None Available. FINDINGS: No evidence of acute fracture. 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 The alignment is normal. No dislocation. No erosion, periostitis or bone destruction. No focal soft tissue abnormalities. IMPRESSION: Unremarkable radiographs of the toe. No radiographic explanation for pain. Electronically Signed   By: Andrea Gasman M.D.   On: 01/19/2024 13:54     Assessment and Plan :     Discharge Instructions       1. Sprain of fifth toe (Primary) - DG Toe 5th Right x-ray performed in UC shows no acute fracture to the right fifth toe. - Apply Post op  shoe for protection and immobilization of the toe until injury heals. - ibuprofen (ADVIL) 600 MG tablet; Take 1 tablet (600 mg total) by mouth every 6 (six) hours as needed.  Dispense: 30 tablet; Refill: 0      Ethel KATHEE A[place]a A[place], [place], [place] 01/19/24 1436

## 2024-01-24 DIAGNOSIS — S93506A Unspecified sprain of unspecified lesser toe(s), initial encounter: Secondary | ICD-10-CM | POA: Diagnosis not present
